# Patient Record
Sex: Female | Born: 1960 | Race: White | Hispanic: No | Marital: Married | Smoking: Never smoker
Health system: Southern US, Community
[De-identification: ages and names within clinical notes are randomized; demographics above are authoritative.]

## PROBLEM LIST (undated history)

## (undated) DIAGNOSIS — F329 Major depressive disorder, single episode, unspecified: Secondary | ICD-10-CM

## (undated) DIAGNOSIS — F32A Depression, unspecified: Secondary | ICD-10-CM

## (undated) DIAGNOSIS — F419 Anxiety disorder, unspecified: Secondary | ICD-10-CM

## (undated) HISTORY — PX: KNEE SURGERY: SHX244

## (undated) HISTORY — PX: ABDOMINAL SURGERY: SHX537

## (undated) HISTORY — PX: TRACHEOSTOMY: SUR1362

---

## 2002-08-05 ENCOUNTER — Emergency Department (HOSPITAL_COMMUNITY): Admission: EM | Admit: 2002-08-05 | Discharge: 2002-08-05 | Payer: Self-pay | Admitting: Emergency Medicine

## 2003-09-26 ENCOUNTER — Other Ambulatory Visit: Admission: RE | Admit: 2003-09-26 | Discharge: 2003-09-26 | Payer: Self-pay | Admitting: Family Medicine

## 2004-10-11 ENCOUNTER — Other Ambulatory Visit: Admission: RE | Admit: 2004-10-11 | Discharge: 2004-10-11 | Payer: Self-pay | Admitting: Family Medicine

## 2006-03-02 ENCOUNTER — Other Ambulatory Visit: Admission: RE | Admit: 2006-03-02 | Discharge: 2006-03-02 | Payer: Self-pay | Admitting: Family Medicine

## 2007-08-09 ENCOUNTER — Other Ambulatory Visit: Admission: RE | Admit: 2007-08-09 | Discharge: 2007-08-09 | Payer: Self-pay | Admitting: Family Medicine

## 2011-05-20 ENCOUNTER — Encounter: Payer: Self-pay | Admitting: *Deleted

## 2011-05-20 ENCOUNTER — Emergency Department
Admission: EM | Admit: 2011-05-20 | Discharge: 2011-05-20 | Disposition: A | Discharge status: Home / Self Care | Payer: BC Managed Care – PPO | Source: Home / Self Care | Attending: Family Medicine | Admitting: Family Medicine

## 2011-05-20 DIAGNOSIS — H6592 Unspecified nonsuppurative otitis media, left ear: Secondary | ICD-10-CM

## 2011-05-20 DIAGNOSIS — H6590 Unspecified nonsuppurative otitis media, unspecified ear: Secondary | ICD-10-CM

## 2011-05-20 DIAGNOSIS — J019 Acute sinusitis, unspecified: Secondary | ICD-10-CM

## 2011-05-20 HISTORY — DX: Wilson's disease: E83.01

## 2011-05-20 MED ORDER — AMOXICILLIN 875 MG PO TABS: 875.0000 mg | ORAL_TABLET | Freq: Two times a day (BID) | ORAL | Status: AC

## 2011-05-20 MED ORDER — PREDNISONE 10 MG PO TABS: ORAL_TABLET | ORAL | Status: DC

## 2011-05-20 NOTE — ED Provider Notes (Signed)
History     CSN: [id number] Arrival date & time: 05/20/2011 11:29 AM   First MD Initiated Contact with Patient 05/20/11 1158      Chief Complaint  Patient presents with  . Cough  . Nasal Congestion  . Otalgia      HPI Comments: Patient states that she had a cold about 1.5 months ago, now resolved, and then possibly the flu about 10 days ago, also resolved.  She has had persistent sinus congestion for the past month.  Over the past several days she has developed a left earache and increased sinus discomfort  The history is provided by the patient.    Past Medical History  Diagnosis Date  . Wilson disease     Past Surgical History  Procedure Date  . Tracheostomy     age 50 due to allergic reaction  . Abdominal surgery     c-section    Family History  Problem Relation Age of Onset  . Diabetes Mother   . Asthma Father     smoker    History  Substance Use Topics  . Smoking status: Never Smoker   . Smokeless tobacco: Not on file  . Alcohol Use: Yes    OB History    Grav Para Term Preterm Abortions TAB SAB Ect Mult Living                  Review of Systems  Constitutional: Positive for fever, chills and fatigue.  HENT: Positive for hearing loss, ear pain, congestion, sore throat, rhinorrhea, postnasal drip and sinus pressure.   Eyes: Negative.   Respiratory: Positive for cough. Negative for shortness of breath.   Cardiovascular: Negative.   Gastrointestinal: Negative.   Genitourinary: Negative.   Musculoskeletal: Negative.   Skin: Negative.   Neurological: Positive for headaches.    Allergies  Dipth, acell pertus,  Home Medications   Current Outpatient Rx  Name Route Sig Dispense Refill  . PENICILLAMINE 250 MG PO CAPS Oral Take 1,000 mg by mouth daily.        BP 127/84  Pulse 86  Temp(Src) 97.6 F (36.4 C) (Oral)  Resp 18  Ht 5\' 2"  (1.575 m)  Wt 154 lb 12 oz (70.194 kg)  BMI 28.30 kg/m2  SpO2 99%  LMP 05/12/2011  Physical Exam  Nursing  note and vitals reviewed. Constitutional: She is oriented to person, place, and time. She appears well-developed and well-nourished. No distress.  HENT:  Right Ear: Tympanic membrane and ear canal normal.  Left Ear: Ear canal normal. A middle ear effusion is present.  Nose: Mucosal edema and sinus tenderness present. Right sinus exhibits maxillary sinus tenderness. Left sinus exhibits maxillary sinus tenderness.       Left ear reveals serous effusion.    Cardiovascular: Regular rhythm and normal heart sounds.   Pulmonary/Chest: Effort normal and breath sounds normal. No respiratory distress. She has no wheezes. She has no rales.  Abdominal: Soft. There is no tenderness.  Musculoskeletal:       Right lower leg: She exhibits no edema.       Left lower leg: She exhibits no edema.  Neurological: She is alert and oriented to person, place, and time.  Skin: Skin is warm and dry. No rash noted.  Psychiatric: She has a normal mood and affect.    ED Course  Procedures  none      1. Acute sinusitis   2. Left serous otitis media  MDM  Begin amoxicillin and tapering course of prednisone.  Take Mucinex D (guaifenesin with decongestant) twice daily for congestion, or may take Mucinex plus Sudafed.  Increase fluid intake, rest. May use Afrin nasal spray (or generic oxymetazoline) twice daily for about 5 days.  Also recommend using saline nasal spray several times daily and/or saline nasal irrigation. Stop all antihistamines for now, and other non-prescription cough/cold preparations. May take Ibuprofen 200mg , 4 tabs every 8 hours with food for chest/sternum discomfort. Recommend flu shot. Follow-up with family doctor if not improving 7 to 10 days.       Donna Christen, MD 05/22/11 [phone]

## 2011-05-20 NOTE — ED Notes (Signed)
Pt c/o cough, nasal congestion, and LT ear ache x 2 wks. She saw her PCP on 1214/12 was dx with a virus, [place] is going out of the country [place] wk and request an ABT. She has taken Mucinex, Theraflu, and Tylenol.

## 2011-06-09 ENCOUNTER — Emergency Department (INDEPENDENT_AMBULATORY_CARE_PROVIDER_SITE_OTHER)
Admission: EM | Admit: 2011-06-09 | Discharge: 2011-06-09 | Disposition: A | Discharge status: Home / Self Care | Payer: BC Managed Care – PPO | Source: Home / Self Care | Attending: Emergency Medicine | Admitting: Emergency Medicine

## 2011-06-09 ENCOUNTER — Encounter: Payer: Self-pay | Admitting: *Deleted

## 2011-06-09 DIAGNOSIS — H6692 Otitis media, unspecified, left ear: Secondary | ICD-10-CM

## 2011-06-09 DIAGNOSIS — J069 Acute upper respiratory infection, unspecified: Secondary | ICD-10-CM

## 2011-06-09 DIAGNOSIS — J329 Chronic sinusitis, unspecified: Secondary | ICD-10-CM

## 2011-06-09 DIAGNOSIS — H6690 Otitis media, unspecified, unspecified ear: Secondary | ICD-10-CM

## 2011-06-09 MED ORDER — AMOXICILLIN-POT CLAVULANATE 875-125 MG PO TABS: 1.0000 | ORAL_TABLET | Freq: Two times a day (BID) | ORAL | Status: AC

## 2011-06-09 NOTE — ED Notes (Signed)
Pt c/o LT ear ache and crackling sound x 1wk. She also c/o some nasal congestion.

## 2011-06-09 NOTE — ED Provider Notes (Signed)
History     CSN: [id number]  Arrival date & time 06/09/11  1556   First MD Initiated Contact with Patient 06/09/11 1604      Chief Complaint  Patient presents with  . Otalgia    (Consider location/radiation/quality/duration/timing/severity/associated sxs/prior treatment) HPI Teresa Payne is a 51 y.o. female who complains of continued upper respiratory symptoms. She was treated with antibiotic and steroid last month which seemed to help a lot. Then she flew to [place] and feels that she got sick again. Her main concern today is left ear pain. She is also complaining of mild runny nose and congestion and sinus pressure especially on the left side.  No throat pain No wheezing No sinus pain/pressure No chest congestion No hemoptysis No SOB No chills/sweats No fever No nausea No vomiting No abdominal pain No diarrhea No skin rashes No fatigue No myalgias No headache     Past Medical History  Diagnosis Date  . Wilson disease     Past Surgical History  Procedure Date  . Tracheostomy     age 48 due to allergic reaction  . Abdominal surgery     c-section    Family History  Problem Relation Age of Onset  . Diabetes Mother   . Asthma Father     smoker    History  Substance Use Topics  . Smoking status: Never Smoker   . Smokeless tobacco: Not on file  . Alcohol Use: Yes    OB History    Grav Para Term Preterm Abortions TAB SAB Ect Mult Living                  Review of Systems  Allergies  Dipth, acell pertus,  Home Medications   Current Outpatient Rx  Name Route Sig Dispense Refill  . FLUOXETINE HCL 20 MG PO CAPS Oral Take 20 mg by mouth daily.      . AMOXICILLIN-POT CLAVULANATE 875-125 MG PO TABS Oral Take 1 tablet by mouth 2 (two) times daily. 20 tablet 0  . PENICILLAMINE 250 MG PO CAPS Oral Take 1,000 mg by mouth daily.      Marland Kitchen PREDNISONE 10 MG PO TABS  Take 2 tabs twice daily for two days, then one tab twice daily for 2 days, then 1 daily for two days.   Take PC 14 tablet 0    BP 133/86  Pulse 102  Temp(Src) 97.9 F (36.6 C) (Oral)  Resp 18  Ht 5\' 2"  (1.575 m)  Wt 156 lb 12 oz (71.101 kg)  BMI 28.67 kg/m2  SpO2 97%  LMP 05/12/2011  Physical Exam  Nursing note and vitals reviewed. Constitutional: She is oriented to person, place, and time. She appears well-developed and well-nourished.  HENT:  Head: Normocephalic and atraumatic.  Right Ear: Tympanic membrane, external ear and ear canal normal.  Left Ear: External ear and ear canal normal. Tympanic membrane is erythematous and bulging.  Nose: Mucosal edema and rhinorrhea present.  Mouth/Throat: Posterior oropharyngeal erythema present. No oropharyngeal exudate or posterior oropharyngeal edema.  Eyes: No scleral icterus.  Neck: Neck supple.  Cardiovascular: Regular rhythm and normal heart sounds.   Pulmonary/Chest: Effort normal and breath sounds normal. No respiratory distress.  Neurological: She is alert and oriented to person, place, and time.  Skin: Skin is warm and dry.  Psychiatric: She has a normal mood and affect. Her speech is normal.    ED Course  Procedures (including critical care time)  Labs Reviewed - No data to display  No results found.   1. Sinusitis   2. Acute upper respiratory infections of unspecified site   3. Otitis media, left       MDM  1)  Take the prescribed antibiotic as instructed.  If she is not improving after this course of antibiotics, I would suggest that she followup with an ear nose throat Dr. 2)  Use nasal saline solution (over the counter) at least 3 times a day. 3)  Use over the counter decongestants like Zyrtec-D every 12 hours as needed to help with congestion.  If you have hypertension, do not take medicines with sudafed.  4)  Can take tylenol every 6 hours or motrin every 8 hours for pain or fever. 5)  Follow up with your primary doctor if no improvement in 5-7 days, sooner if increasing pain, fever, or new symptoms.        Lily Kocher, MD 06/09/11 [phone]

## 2011-07-01 ENCOUNTER — Encounter: Payer: Self-pay | Admitting: *Deleted

## 2011-07-01 ENCOUNTER — Emergency Department
Admit: 2011-07-01 | Discharge: 2011-07-01 | Disposition: A | Discharge status: Home / Self Care | Payer: BC Managed Care – PPO

## 2011-07-01 ENCOUNTER — Emergency Department
Admission: EM | Admit: 2011-07-01 | Discharge: 2011-07-01 | Disposition: A | Discharge status: Home / Self Care | Payer: BC Managed Care – PPO | Source: Home / Self Care | Attending: Family Medicine | Admitting: Family Medicine

## 2011-07-01 DIAGNOSIS — H6990 Unspecified Eustachian tube disorder, unspecified ear: Secondary | ICD-10-CM

## 2011-07-01 DIAGNOSIS — J0100 Acute maxillary sinusitis, unspecified: Secondary | ICD-10-CM

## 2011-07-01 DIAGNOSIS — H6980 Other specified disorders of Eustachian tube, unspecified ear: Secondary | ICD-10-CM

## 2011-07-01 MED ORDER — PREDNISONE 10 MG PO TABS: ORAL_TABLET | ORAL | Status: DC

## 2011-07-01 MED ORDER — AMOXICILLIN-POT CLAVULANATE 875-125 MG PO TABS: 1.0000 | ORAL_TABLET | Freq: Two times a day (BID) | ORAL | Status: AC

## 2011-07-01 MED ORDER — MOMETASONE FUROATE 50 MCG/ACT NA SUSP: NASAL | Status: AC

## 2011-07-01 NOTE — ED Notes (Signed)
Patient has been treated for sinusitis 2 times in the past month. She has returned with the same symptoms. She finished her latest antibiotic. Denies fever. C/o sinus pain, congestion and HA.

## 2011-07-01 NOTE — ED Provider Notes (Signed)
History     CSN: [id number]  Arrival date & time 07/01/11  7829   First MD Initiated Contact with Patient 07/01/11 1014      Chief Complaint  Patient presents with  . Sinusitis     HPI Comments: Patient reports that her sinus congestion improved after her previous treatment, but she has now developed recurrent left sinus congestion, worse in the mornings.  She also complains of constant feeling of mild blockage in her left ear and slightly decreased hearing there also.  She develops a left side headache several times weekly when her sinus congestion is worse.  Her right nasal passages are generally clear.  Her nasal mucous has become cloudy over the past several days.  No sore throat.  No fevers, chills, and sweats.  Occasional cough.  She feels well otherwise.  The history is provided by the patient.    Past Medical History  Diagnosis Date  . Wilson disease     Past Surgical History  Procedure Date  . Tracheostomy     age 62 due to allergic reaction  . Abdominal surgery     c-section    Family History  Problem Relation Age of Onset  . Diabetes Mother   . Asthma Father     smoker    History  Substance Use Topics  . Smoking status: Never Smoker   . Smokeless tobacco: Not on file  . Alcohol Use: Yes    OB History    Grav Para Term Preterm Abortions TAB SAB Ect Mult Living                  Review of Systems No sore throat + cough No pleuritic pain No wheezing + nasal congestion + post-nasal drainage + sinus pain/pressure No itchy/red eyes No earache, but left ear feels clogged No hemoptysis No SOB No fever/chills No nausea No vomiting No abdominal pain No diarrhea No urinary symptoms No skin rashes No fatigue No myalgias + left headache Used OTC meds without relief  Allergies  Dipth, acell pertus,  Home Medications   Current Outpatient Rx  Name Route Sig Dispense Refill  . AMOXICILLIN-POT CLAVULANATE 875-125 MG PO TABS Oral Take 1 tablet by  mouth 2 (two) times daily. Take with food 20 tablet 0  . FLUOXETINE HCL 20 MG PO CAPS Oral Take 20 mg by mouth daily.      . MOMETASONE FUROATE 50 MCG/ACT NA SUSP  Place two sprays in each nostril once daily 17 g 1  . PENICILLAMINE 250 MG PO CAPS Oral Take 1,000 mg by mouth daily.      Marland Kitchen PREDNISONE 10 MG PO TABS  Take 2 tabs twice daily for two days, then one tab twice daily for 2 days, then 1 daily for two days.  Take PC 14 tablet 0    BP 122/89  Pulse 90  Temp(Src) 98.2 F (36.8 C) (Oral)  Resp 16  SpO2 98%  Physical Exam Nursing notes and Vital Signs reviewed. Appearance:  Patient appears healthy, stated age, and in no acute distress Eyes:  Pupils are equal, round, and reactive to light and accomodation.  Extraocular movement is intact.  Conjunctivae are not inflamed.  Fundi benign  Ears:  Canals normal.  Tympanic membranes normal.  Nose:  Moderately congested turbinates, worse on the left.   Mild left maxillary and frontal sinus tenderness is present.  Pharynx:  Normal Neck:  Supple.  Slightly tender shotty anterior/posterior nodes are palpated on the  left. Lungs:  Clear to auscultation.  Breath sounds are equal.  Heart:  Regular rate and rhythm without murmurs, rubs, or gallops.  Skin:  No rash present.  Neurologic:  Cranial nerves 2 through 12 are normal.  Patellar, achilles, and elbow reflexes are normal.  Cerebellar function is intact (finger-to-nose and rapid alternating hand movement).  Gait and station are normal.    ED Course  Procedures none  Labs Reviewed - Tympanogram normal in right ear and wide in left ear. Dg Sinuses Complete  07/01/2011  *RADIOLOGY REPORT*  Clinical Data: Sinus congestion, pressure, headaches  PARANASAL SINUSES - COMPLETE 3 + VIEW  Comparison: None.  Findings: Frontal, ethmoid, maxillary and sphenoid sinuses appear well aerated.  No significant sinus opacification, asymmetry, or air-fluid level demonstrated by plain radiography.  Orbits appear  symmetric.  Mastoids appear clear.  Dental hardware noted.  IMPRESSION: Clear sinuses by plain radiography.  Original Report Authenticated By: Judie Petit. Ruel Favors, M.D.     1. Eustachian tube dysfunction   2. Acute maxillary sinusitis       MDM  Suspect chronic rhinitis.  Although left TM appears normal, suspect left otitis media with abnormal tympanogram. Repeat course of antibiotic with Augmentin.  Repeat tapering course of prednisone.  Begin Nasonex nasal spray. Followup with ENT in two weeks.        Donna Christen, MD 07/01/11 1209

## 2011-11-02 ENCOUNTER — Other Ambulatory Visit: Payer: Self-pay | Admitting: Family Medicine

## 2012-08-05 ENCOUNTER — Encounter: Payer: Self-pay | Admitting: *Deleted

## 2012-08-05 ENCOUNTER — Emergency Department
Admission: EM | Admit: 2012-08-05 | Discharge: 2012-08-05 | Disposition: A | Discharge status: Home / Self Care | Payer: BC Managed Care – PPO | Source: Home / Self Care

## 2012-08-05 DIAGNOSIS — H6591 Unspecified nonsuppurative otitis media, right ear: Secondary | ICD-10-CM

## 2012-08-05 DIAGNOSIS — H6590 Unspecified nonsuppurative otitis media, unspecified ear: Secondary | ICD-10-CM

## 2012-08-05 MED ORDER — AMOXICILLIN 875 MG PO TABS: 875.0000 mg | ORAL_TABLET | Freq: Two times a day (BID) | ORAL | Status: DC

## 2012-08-05 MED ORDER — PREDNISONE 20 MG PO TABS: 20.0000 mg | ORAL_TABLET | Freq: Two times a day (BID) | ORAL | Status: DC

## 2012-08-05 NOTE — ED Provider Notes (Signed)
History     CSN: [id number]  Arrival date & time 08/05/12  0840   None     Chief Complaint  Patient presents with  . Otalgia  . Sinus Problem       HPI Comments: Patient has a history of perennial allergies and normally has a frequent cough.  Five days ago she developed a mild sore throat, increased sinus congestion, fatigue, myalgias, and chills.  Her cough gradually became worse, and last night she developed a right earache. She takes Zyrtec daily.  The history is provided by the patient.    Past Medical History  Diagnosis Date  . Wilson disease     Past Surgical History  Procedure Laterality Date  . Tracheostomy      age 50 due to allergic reaction  . Abdominal surgery      c-section    Family History  Problem Relation Age of Onset  . Diabetes Mother   . Asthma Father     smoker    History  Substance Use Topics  . Smoking status: Never Smoker   . Smokeless tobacco: Not on file  . Alcohol Use: Yes    OB History   Grav Para Term Preterm Abortions TAB SAB Ect Mult Living                  Review of Systems + sore throat, resolved + cough No pleuritic pain No wheezing + nasal congestion + post-nasal drainage + sinus pain/pressure No itchy/red eyes + right earache No hemoptysis No SOB No fever, + chills No nausea No vomiting No abdominal pain No diarrhea No urinary symptoms No skin rashes + fatigue + myalgias + headache Used OTC meds without relief   Allergies  Dipth, acell pertus,  Home Medications   Current Outpatient Rx  Name  Route  Sig  Dispense  Refill  . amoxicillin (AMOXIL) 875 MG tablet   Oral   Take 1 tablet (875 mg total) by mouth 2 (two) times daily.   20 tablet   0   . FLUoxetine (PROZAC) 20 MG capsule   Oral   Take 20 mg by mouth daily.           . mometasone (NASONEX) 50 MCG/ACT nasal spray      Place two sprays in each nostril once daily   17 g   1   . penicillAMINE (CUPRIMINE) 250 MG capsule   Oral  Take 1,000 mg by mouth daily.           . predniSONE (DELTASONE) 20 MG tablet   Oral   Take 1 tablet (20 mg total) by mouth 2 (two) times daily. Take with food.   10 tablet   0     BP 130/85  Pulse 93  Temp(Src) 98 F (36.7 C) (Oral)  Ht 5\' 2"  (1.575 m)  Wt 157 lb (71.215 kg)  BMI 28.71 kg/m2  SpO2 99%  Physical Exam Nursing notes and Vital Signs reviewed. Appearance:  Patient appears healthy, stated age, and in no acute distress Eyes:  Pupils are equal, round, and reactive to light and accomodation.  Extraocular movement is intact.  Conjunctivae are not inflamed  Ears:  Canals normal.  Left tympanic membrane normal.  Right tympanic membrane red and bulging with effusion.  Nose:  Congested turbinates, completely occluded on right.  Frontal and right maxillary sinus tenderness is present.  Pharynx:  Normal Neck:  Supple.   Tender shotty posterior nodes are palpated bilaterally  Lungs:  Clear to auscultation.  Breath sounds are equal.  Heart:  Regular rate and rhythm without murmurs, rubs, or gallops.  Abdomen:  Nontender without masses or hepatosplenomegaly.  Bowel sounds are present.  No CVA or flank tenderness.  Extremities:  No edema.  No calf tenderness Skin:  No rash present.   ED Course  Procedures  none      1. Right otitis media with effusion   2. Acute maxillary sinusitis   3. Acute upper respiratory infections of unspecified site       MDM  Begin amoxicillin for 10 days, and prednisone burst. Take Mucinex D (guaifenesin with decongestant) twice daily for congestion.  Increase fluid intake, rest. May use Afrin nasal spray (or generic oxymetazoline) twice daily for about 5 days.  Also recommend using saline nasal spray several times daily and saline nasal irrigation (AYR is a common brand) Stop all antihistamines for now, and other non-prescription cough/cold preparations. May take Delsym Cough Suppressant at bedtime for nighttime cough.  Followup with ENT  if not improving.        Lattie Haw, MD 08/05/12 1040

## 2012-08-05 NOTE — ED Notes (Signed)
Teresa Payne c/o right ear pain x last night. Sinus congestion and drainage x 4 days. Used Mucinex D and Alka seltzer otc.

## 2012-09-25 ENCOUNTER — Emergency Department
Admission: EM | Admit: 2012-09-25 | Discharge: 2012-09-25 | Disposition: A | Discharge status: Home / Self Care | Payer: BC Managed Care – PPO | Source: Home / Self Care | Attending: Family Medicine | Admitting: Family Medicine

## 2012-09-25 ENCOUNTER — Encounter: Payer: Self-pay | Admitting: Emergency Medicine

## 2012-09-25 DIAGNOSIS — B309 Viral conjunctivitis, unspecified: Secondary | ICD-10-CM

## 2012-09-25 DIAGNOSIS — J069 Acute upper respiratory infection, unspecified: Secondary | ICD-10-CM

## 2012-09-25 MED ORDER — AMOXICILLIN 875 MG PO TABS: 875.0000 mg | ORAL_TABLET | Freq: Two times a day (BID) | ORAL | Status: DC

## 2012-09-25 MED ORDER — KETOROLAC TROMETHAMINE 0.5 % OP SOLN: 1.0000 [drp] | Freq: Four times a day (QID) | OPHTHALMIC | Status: AC

## 2012-09-25 NOTE — ED Notes (Signed)
Reports conjunctivitis and sticky eye discharge today; has had sinus problems past few days; works in [employer] facility and "pink eye" is circulating.

## 2012-09-25 NOTE — ED Provider Notes (Signed)
History     CSN: [id number]  Arrival date & time 09/25/12  1439   First MD Initiated Contact with Patient 09/25/12 1517      Chief Complaint  Patient presents with  . Conjunctivitis       HPI Comments: Patient complains of increased sinus congestion for about a week, including mild sore throat.  She has also developed a mild cough.  Today she noted mild bilateral pink eye with increased lacrimation.  She has a history of seasonal allergies also.  No fevers, chills, and sweats   The history is provided by the patient.    Past Medical History  Diagnosis Date  . Wilson disease     Past Surgical History  Procedure Laterality Date  . Tracheostomy      age 4 due to allergic reaction  . Abdominal surgery      c-section  . Knee surgery      Family History  Problem Relation Age of Onset  . Diabetes Mother   . Asthma Father     smoker    History  Substance Use Topics  . Smoking status: Never Smoker   . Smokeless tobacco: Not on file  . Alcohol Use: Yes    OB History   Grav Para Term Preterm Abortions TAB SAB Ect Mult Living                  Review of Systems + sore throat, resolved + cough No pleuritic pain No wheezing + nasal congestion + post-nasal drainage No sinus pain/pressure + itchy/red eyes No earache No hemoptysis No SOB No fever/chills No nausea No vomiting No abdominal pain No diarrhea No urinary symptoms No skin rashes + fatigue No myalgias No headache Used OTC meds without relief  Allergies  Dipth, acell pertus,  Home Medications   Current Outpatient Rx  Name  Route  Sig  Dispense  Refill  . amoxicillin (AMOXIL) 875 MG tablet   Oral   Take 1 tablet (875 mg total) by mouth 2 (two) times daily. (Rx void after 10/03/12)   20 tablet   0   . FLUoxetine (PROZAC) 20 MG capsule   Oral   Take 20 mg by mouth daily.           Marland Kitchen ketorolac (ACULAR) 0.5 % ophthalmic solution   Both Eyes   Place 1 drop into both eyes 4 (four) times  daily.   3 mL   0   . mometasone (NASONEX) 50 MCG/ACT nasal spray      Place two sprays in each nostril once daily   17 g   1   . penicillAMINE (CUPRIMINE) 250 MG capsule   Oral   Take 1,000 mg by mouth daily.           . predniSONE (DELTASONE) 20 MG tablet   Oral   Take 1 tablet (20 mg total) by mouth 2 (two) times daily. Take with food.   10 tablet   0     BP 110/70  Pulse 92  Temp(Src) 97.8 F (36.6 C) (Oral)  Resp 18  Ht 5\' 2"  (1.575 m)  Wt 158 lb (71.668 kg)  BMI 28.89 kg/m2  SpO2 97%  Physical Exam Nursing notes and Vital Signs reviewed. Appearance:  Patient appears healthy, stated age, and in no acute distress Eyes:  Pupils are equal, round, and reactive to light and accomodation.  Extraocular movement is intact.  Conjunctivae are minimally injected.  No discharge.  No  lid swelling.  No photophobia. Ears:  Canals normal.  Tympanic membranes normal.  Nose:  Mildly congested turbinates.  No sinus tenderness.  Marland Kitchen  Pharynx:  Normal Neck:  Supple.   Tender shotty posterior nodes are palpated bilaterally  Lungs:  Clear to auscultation.  Breath sounds are equal.  Heart:  Regular rate and rhythm without murmurs, rubs, or gallops.  Abdomen:  Nontender without masses or hepatosplenomegaly.  Bowel sounds are present.  No CVA or flank tenderness.  Extremities:  No edema.  No calf tenderness Skin:  No rash present.   ED Course  Procedures  none      1. Acute upper respiratory infections of unspecified site; suspect viral URI  2. Viral conjunctivitis       MDM  Begin Acular opthal soln Take Mucinex D (guaifenesin with decongestant) twice daily for congestion.  Increase fluid intake, rest. May use Afrin nasal spray (or generic oxymetazoline) twice daily for about 5 days.  Also recommend using saline nasal spray several times daily and saline nasal irrigation (AYR is a common brand) Stop using contact lens until eye irritation has resolved. Stop all  antihistamines for now, and other non-prescription cough/cold preparations. May take Delsym Cough Suppressant at bedtime for nighttime cough.  Begin Amoxicillin if not improving about 5 days or if persistent fever develops (Given a prescription to hold, with an expiration date)  Follow-up with family doctor if not improving 7 to 10 days.        Lattie Haw, MD 09/25/12 [phone]

## 2014-05-05 ENCOUNTER — Emergency Department
Admission: EM | Admit: 2014-05-05 | Discharge: 2014-05-05 | Disposition: A | Discharge status: Home / Self Care | Payer: BC Managed Care – PPO | Source: Home / Self Care | Attending: Family Medicine | Admitting: Family Medicine

## 2014-05-05 ENCOUNTER — Encounter: Payer: Self-pay | Admitting: Emergency Medicine

## 2014-05-05 DIAGNOSIS — J0101 Acute recurrent maxillary sinusitis: Secondary | ICD-10-CM

## 2014-05-05 DIAGNOSIS — B9789 Other viral agents as the cause of diseases classified elsewhere: Secondary | ICD-10-CM

## 2014-05-05 DIAGNOSIS — J069 Acute upper respiratory infection, unspecified: Secondary | ICD-10-CM

## 2014-05-05 HISTORY — DX: Major depressive disorder, single episode, unspecified: F32.9

## 2014-05-05 HISTORY — DX: Anxiety disorder, unspecified: F41.9

## 2014-05-05 HISTORY — DX: Depression, unspecified: F32.A

## 2014-05-05 MED ORDER — AMOXICILLIN 875 MG PO TABS: 875.0000 mg | ORAL_TABLET | Freq: Two times a day (BID) | ORAL | Status: DC

## 2014-05-05 MED ORDER — PREDNISONE 20 MG PO TABS: 20.0000 mg | ORAL_TABLET | Freq: Two times a day (BID) | ORAL | Status: DC

## 2014-05-05 MED ORDER — BENZONATATE 200 MG PO CAPS: 200.0000 mg | ORAL_CAPSULE | Freq: Every day | ORAL | Status: DC

## 2014-05-05 NOTE — Discharge Instructions (Signed)
Take plain Mucinex (1200 mg guaifenesin) twice daily for cough and congestion.  May add Sudafed for sinus congestion.   Increase fluid intake, rest. May use Afrin nasal spray (or generic oxymetazoline) twice daily for about 5 days.  Also recommend using saline nasal spray several times daily and saline nasal irrigation (AYR is a common brand).  Continue Qnasl spray. Try warm salt water gargles for sore throat.  Stop all antihistamines for now, and other non-prescription cough/cold preparations. May take Ibuprofen 200mg , 4 tabs every 8 hours with food for chest/sternum discomfort.   Follow-up with family doctor if not improving 7 to 10 days.

## 2014-05-05 NOTE — ED Provider Notes (Signed)
CSN: [id number]     Arrival date & time 05/05/14  1105 History   First MD Initiated Contact with Patient 05/05/14 1153     Chief Complaint  Patient presents with  . Nasal Congestion  . Generalized Body Aches  . Cough  . Fatigue  . Headache  . Sore Throat      HPI Comments: About 10 days ago patient developed URI symptoms wit sore throat, fever/chills, fatigue, myalgias, sinus congestion, decreased appetite, and productive cough.  She has developed increasing sinus congestion and facial pressure, and has now developed night sweats.  The history is provided by the patient.    Past Medical History  Diagnosis Date  . Wilson disease   . Anxiety and depression    Past Surgical History  Procedure Laterality Date  . Tracheostomy      age 39 due to allergic reaction  . Abdominal surgery      c-section  . Knee surgery     Family History  Problem Relation Age of Onset  . Diabetes Mother   . Asthma Father     smoker   History  Substance Use Topics  . Smoking status: Never Smoker   . Smokeless tobacco: Not on file  . Alcohol Use: Yes   OB History    No data available     Review of Systems + sore throat + cough No pleuritic pain + wheezing + nasal congestion + post-nasal drainage + sinus pain/pressure + red eyes No earache No hemoptysis + SOB + fever, + chills/sweats No nausea No vomiting No abdominal pain No diarrhea No urinary symptoms No skin rash + fatigue + myalgias + headache Used OTC meds without relief  Allergies  Dipth, acell pertus,  Home Medications   Prior to Admission medications   Medication Sig Start Date End Date Taking? Authorizing Provider  amoxicillin (AMOXIL) 875 MG tablet Take 1 tablet (875 mg total) by mouth 2 (two) times daily. 05/05/14   Lattie HawStephen A Fredis Malkiewicz, MD  benzonatate (TESSALON) 200 MG capsule Take 1 capsule (200 mg total) by mouth at bedtime. Take as needed for cough 05/05/14   Lattie HawStephen A Marzella Miracle, MD  FLUoxetine (PROZAC) 20 MG  capsule Take 20 mg by mouth daily.      Historical Provider, MD  ketorolac (ACULAR) 0.5 % ophthalmic solution Place 1 drop into both eyes 4 (four) times daily. 09/25/12   Lattie HawStephen A Alonia Dibuono, MD  mometasone (NASONEX) 50 MCG/ACT nasal spray Place two sprays in each nostril once daily 07/01/11   Lattie HawStephen A Mattalyn Anderegg, MD  penicillAMINE (CUPRIMINE) 250 MG capsule Take 1,000 mg by mouth daily.      Historical Provider, MD  predniSONE (DELTASONE) 20 MG tablet Take 1 tablet (20 mg total) by mouth 2 (two) times daily. Take with food. 05/05/14   Lattie HawStephen A Johnella Crumm, MD   BP 121/79 mmHg  Pulse 90  Temp(Src) 98.2 F (36.8 C) (Oral)  Resp 16  Ht 5\' 2"  (1.575 m)  Wt 157 lb (71.215 kg)  BMI 28.71 kg/m2  SpO2 97%  LMP 05/12/2011 Physical Exam Nursing notes and Vital Signs reviewed. Appearance:  Patient appears healthy, stated age, and in no acute distress Eyes:  Pupils are equal, round, and reactive to light and accomodation.  Extraocular movement is intact.  Conjunctivae are not inflamed  Ears:  Canals normal.  Tympanic membranes normal.  Nose:  Congested turbinates.  Maxillary sinus tenderness is present.  Pharynx:  Normal Neck:  Supple.   Tender posterior nodes  are palpated bilaterally  Lungs:  Clear to auscultation.  Breath sounds are equal.  Chest:  Distinct tenderness to palpation over the mid-sternum.  Heart:  Regular rate and rhythm without murmurs, rubs, or gallops.  Abdomen:  Nontender without masses or hepatosplenomegaly.  Bowel sounds are present.  No CVA or flank tenderness.  Extremities:  No edema.  No calf tenderness Skin:  No rash present.   ED Course  Procedures  none   MDM   1. Acute recurrent maxillary sinusitis   2. Viral URI with cough    Begin amoxicillin and prednisone burst.  Prescription written for Benzonatate (Tessalon) to take at bedtime for night-time cough.  Take plain Mucinex (1200 mg guaifenesin) twice daily for cough and congestion.  May add Sudafed for sinus congestion.    Increase fluid intake, rest. May use Afrin nasal spray (or generic oxymetazoline) twice daily for about 5 days.  Also recommend using saline nasal spray several times daily and saline nasal irrigation (AYR is a common brand).  Continue Qnasl spray. Try warm salt water gargles for sore throat.  Stop all antihistamines for now, and other non-prescription cough/cold preparations. May take Ibuprofen 200mg , 4 tabs every 8 hours with food for chest/sternum discomfort.   Follow-up with family doctor if not improving 7 to 10 days.     Lattie HawStephen A Airica Schwartzkopf, MD 05/07/14 [phone]

## 2014-05-05 NOTE — ED Notes (Signed)
Reports 11 days of congestion, cough, aches, sore throat, extreme fatigue and intermittent fever; has been taking Thera-Flu and other OTCs without success.

## 2014-05-30 ENCOUNTER — Emergency Department (INDEPENDENT_AMBULATORY_CARE_PROVIDER_SITE_OTHER): Payer: BC Managed Care – PPO

## 2014-05-30 ENCOUNTER — Encounter: Payer: Self-pay | Admitting: Emergency Medicine

## 2014-05-30 ENCOUNTER — Emergency Department
Admission: EM | Admit: 2014-05-30 | Discharge: 2014-05-30 | Disposition: A | Discharge status: Home / Self Care | Payer: BC Managed Care – PPO | Source: Home / Self Care | Attending: Emergency Medicine | Admitting: Emergency Medicine

## 2014-05-30 DIAGNOSIS — R059 Cough, unspecified: Secondary | ICD-10-CM

## 2014-05-30 DIAGNOSIS — R05 Cough: Secondary | ICD-10-CM

## 2014-05-30 DIAGNOSIS — J189 Pneumonia, unspecified organism: Secondary | ICD-10-CM | POA: Diagnosis not present

## 2014-05-30 MED ORDER — CEFTRIAXONE SODIUM 1 G IJ SOLR
1.0000 g | INTRAMUSCULAR | Status: DC
  Administered 2014-05-30 (×2): 1 g via INTRAMUSCULAR

## 2014-05-30 MED ORDER — PREDNISONE 50 MG PO TABS: ORAL_TABLET | ORAL | Status: DC

## 2014-05-30 MED ORDER — AZITHROMYCIN 250 MG PO TABS: 250.0000 mg | ORAL_TABLET | Freq: Every day | ORAL | Status: DC

## 2014-05-30 MED ORDER — ALBUTEROL SULFATE HFA 108 (90 BASE) MCG/ACT IN AERS: 1.0000 | INHALATION_SPRAY | Freq: Four times a day (QID) | RESPIRATORY_TRACT | Status: AC | PRN

## 2014-05-30 MED ORDER — AZITHROMYCIN 500 MG PO TABS: 1000.0000 mg | ORAL_TABLET | Freq: Once | ORAL | Status: DC

## 2014-05-30 NOTE — Discharge Instructions (Signed)
Pneumonia  Pneumonia is an infection of the lungs.   CAUSES  Pneumonia may be caused by bacteria or a virus. Usually, these infections are caused by breathing infectious particles into the lungs (respiratory tract).  SIGNS AND SYMPTOMS   · Cough.  · Fever.  · Chest pain.  · Increased rate of breathing.  · Wheezing.  · Mucus production.  DIAGNOSIS   If you have the common symptoms of pneumonia, your health care provider will typically confirm the diagnosis with a chest X-ray. The X-ray will show an abnormality in the lung (pulmonary infiltrate) if you have pneumonia. Other tests of your blood, urine, or sputum may be done to find the specific cause of your pneumonia. Your health care provider may also do tests (blood gases or pulse oximetry) to see how well your lungs are working.  TREATMENT   Some forms of pneumonia may be spread to other people when you cough or sneeze. You may be asked to wear a mask before and during your exam. Pneumonia that is caused by bacteria is treated with antibiotic medicine. Pneumonia that is caused by the influenza virus may be treated with an antiviral medicine. Most other viral infections must run their course. These infections will not respond to antibiotics.   HOME CARE INSTRUCTIONS   · Cough suppressants may be used if you are losing too much rest. However, coughing protects you by clearing your lungs. You should avoid using cough suppressants if you can.  · Your health care provider may have prescribed medicine if he or she thinks your pneumonia is caused by bacteria or influenza. Finish your medicine even if you start to feel better.  · Your health care provider may also prescribe an expectorant. This loosens the mucus to be coughed up.  · Take medicines only as directed by your health care provider.  · Do not smoke. Smoking is a common cause of bronchitis and can contribute to pneumonia. If you are a smoker and continue to smoke, your cough may last several weeks after your  pneumonia has cleared.  · A cold steam vaporizer or humidifier in your room or home may help loosen mucus.  · Coughing is often worse at night. Sleeping in a semi-upright position in a recliner or using a couple pillows under your head will help with this.  · Get rest as you feel it is needed. Your body will usually let you know when you need to rest.  PREVENTION  A pneumococcal shot (vaccine) is available to prevent a common bacterial cause of pneumonia. This is usually suggested for:  · People over 65 years old.  · Patients on chemotherapy.  · People with chronic lung problems, such as bronchitis or emphysema.  · People with immune system problems.  If you are over 65 or have a high risk condition, you may receive the pneumococcal vaccine if you have not received it before. In some countries, a routine influenza vaccine is also recommended. This vaccine can help prevent some cases of pneumonia. You may be offered the influenza vaccine as part of your care.  If you smoke, it is time to quit. You may receive instructions on how to stop smoking. Your health care provider can provide medicines and counseling to help you quit.  SEEK MEDICAL CARE IF:  You have a fever.  SEEK IMMEDIATE MEDICAL CARE IF:   · Your illness becomes worse. This is especially true if you are elderly or weakened from any other disease.  ·   You cannot control your cough with suppressants and are losing sleep.  · You begin coughing up blood.  · You develop pain which is getting worse or is uncontrolled with medicines.  · Any of the symptoms which initially brought you in for treatment are getting worse rather than better.  · You develop shortness of breath or chest pain.  MAKE SURE YOU:   · Understand these instructions.  · Will watch your condition.  · Will get help right away if you are not doing well or get worse.  Document Released: 05/19/2005 Document Revised: 10/03/2013 Document Reviewed: 08/08/2010  ExitCare® Patient Information ©2015  [employer]. This information is not intended to replace advice given to you by your health care provider. Make sure you discuss any questions you have with your health care provider.    Asthma  Asthma is a recurring condition in which the airways tighten and narrow. Asthma can make it difficult to breathe. It can cause coughing, wheezing, and shortness of breath. Asthma episodes, also called asthma attacks, range from minor to life-threatening. Asthma cannot be cured, but medicines and lifestyle changes can help control it.  CAUSES  Asthma is believed to be caused by inherited (genetic) and environmental factors, but its exact cause is unknown. Asthma may be triggered by allergens, lung infections, or irritants in the air. Asthma triggers are different for each person. Common triggers include:   · Animal dander.  · Dust mites.  · Cockroaches.  · Pollen from trees or grass.  · Mold.  · Smoke.  · Air pollutants such as dust, household cleaners, hair sprays, aerosol sprays, paint fumes, strong chemicals, or strong odors.  · Cold air, weather changes, and winds (which increase molds and pollens in the air).  · Strong emotional expressions such as crying or laughing hard.  · Stress.  · Certain medicines (such as aspirin) or types of drugs (such as beta-blockers).  · Sulfites in foods and drinks. Foods and drinks that may contain sulfites include dried fruit, potato chips, and sparkling grape juice.  · Infections or inflammatory conditions such as the flu, a cold, or an inflammation of the nasal membranes (rhinitis).  · Gastroesophageal reflux disease (GERD).  · Exercise or strenuous activity.  SYMPTOMS  Symptoms may occur immediately after asthma is triggered or many hours later. Symptoms include:  · Wheezing.  · Excessive nighttime or early morning coughing.  · Frequent or severe coughing with a common cold.  · Chest tightness.  · Shortness of breath.  DIAGNOSIS   The diagnosis of asthma is made by a review of your  medical history and a physical exam. Tests may also be performed. These may include:  · Lung function studies. These tests show how much air you breathe in and out.  · Allergy tests.  · Imaging tests such as X-rays.  TREATMENT   Asthma cannot be cured, but it can usually be controlled. Treatment involves identifying and avoiding your asthma triggers. It also involves medicines. There are 2 classes of medicine used for asthma treatment:   · Controller medicines. These prevent asthma symptoms from occurring. They are usually taken every day.  · Reliever or rescue medicines. These quickly relieve asthma symptoms. They are used as needed and provide short-term relief.  Your health care provider will help you create an asthma action plan. An asthma action plan is a written plan for managing and treating your asthma attacks. It includes a list of your asthma triggers and how   they may be avoided. It also includes information on when medicines should be taken and when their dosage should be changed. An action plan may also involve the use of a device called a peak flow meter. A peak flow meter measures how well the lungs are working. It helps you monitor your condition.  HOME CARE INSTRUCTIONS   · Take medicines only as directed by your health care provider. Speak with your health care provider if you have questions about how or when to take the medicines.  · Use a peak flow meter as directed by your health care provider. Record and keep track of readings.  · Understand and use the action plan to help minimize or stop an asthma attack without needing to seek medical care.  · Control your home environment in the following ways to help prevent asthma attacks:  ¨ Do not smoke. Avoid being exposed to secondhand smoke.  ¨ Change your heating and air conditioning filter regularly.  ¨ Limit your use of fireplaces and wood stoves.  ¨ Get rid of pests (such as roaches and mice) and their droppings.  ¨ Throw away plants if you see  mold on them.  ¨ Clean your floors and dust regularly. Use unscented cleaning products.  ¨ Try to have someone else vacuum for you regularly. Stay out of rooms while they are being vacuumed and for a short while afterward. If you vacuum, use a dust mask from a hardware store, a double-layered or microfilter vacuum cleaner bag, or a vacuum cleaner with a HEPA filter.  ¨ Replace carpet with wood, tile, or vinyl flooring. Carpet can trap dander and dust.  ¨ Use allergy-proof pillows, mattress covers, and box spring covers.  ¨ Wash bed sheets and blankets every week in hot water and dry them in a dryer.  ¨ Use blankets that are made of polyester or cotton.  ¨ Clean bathrooms and kitchens with bleach. If possible, have someone repaint the walls in these rooms with mold-resistant paint. Keep out of the rooms that are being cleaned and painted.  ¨ Wash hands frequently.  SEEK MEDICAL CARE IF:   · You have wheezing, shortness of breath, or a cough even if taking medicine to prevent attacks.  · The colored mucus you cough up (sputum) is thicker than usual.  · Your sputum changes from clear or white to yellow, green, gray, or bloody.  · You have any problems that may be related to the medicines you are taking (such as a rash, itching, swelling, or trouble breathing).  · You are using a reliever medicine more than 2-3 times per week.  · Your peak flow is still at 50-79% of your personal best after following your action plan for 1 hour.  · You have a fever.  SEEK IMMEDIATE MEDICAL CARE IF:   · You seem to be getting worse and are unresponsive to treatment during an asthma attack.  · You are short of breath even at rest.  · You get short of breath when doing very little physical activity.  · You have difficulty eating, drinking, or talking due to asthma symptoms.  · You develop chest pain.  · You develop a fast heartbeat.  · You have a bluish color to your lips or fingernails.  · You are light-headed, dizzy, or faint.  · Your  peak flow is less than 50% of your personal best.  MAKE SURE YOU:   · Understand these instructions.  · Will watch your   condition.  · Will get help right away if you are not doing well or get worse.  Document Released: 05/19/2005 Document Revised: 10/03/2013 Document Reviewed: 12/16/2012  ExitCare® Patient Information ©2015 [employer]. This information is not intended to replace advice given to you by your health care provider. Make sure you discuss any questions you have with your health care provider.

## 2014-05-30 NOTE — ED Notes (Signed)
Productive cough, green mucus x 2 weeks, congested, cant sleep

## 2014-05-30 NOTE — ED Provider Notes (Signed)
CSN: [id number]     Arrival date & time 05/30/14  1254 History   First MD Initiated Contact with Patient 05/30/14 1323     Chief Complaint  Patient presents with  . Cough   (Consider location/radiation/quality/duration/timing/severity/associated sxs/prior Treatment) Patient is a 53 y.o. female presenting with cough. The history is provided by the patient. No language interpreter was used.  Cough Cough characteristics:  Productive Sputum characteristics:  Nondescript Severity:  Moderate Onset quality:  Gradual Duration:  1 week Timing:  Constant Chronicity:  New Smoker: no   Context: with activity   Relieved by:  Nothing Worsened by:  Nothing tried Ineffective treatments:  None tried Associated symptoms: myalgias   Associated symptoms: no chest pain and no shortness of breath   Risk factors: recent infection     Past Medical History  Diagnosis Date  . Wilson disease   . Anxiety and depression    Past Surgical History  Procedure Laterality Date  . Tracheostomy      age 70 due to allergic reaction  . Abdominal surgery      c-section  . Knee surgery     Family History  Problem Relation Age of Onset  . Diabetes Mother   . Asthma Father     smoker   History  Substance Use Topics  . Smoking status: Never Smoker   . Smokeless tobacco: Not on file  . Alcohol Use: Yes   OB History    No data available     Review of Systems  Respiratory: Positive for cough. Negative for shortness of breath.   Cardiovascular: Negative for chest pain.  Musculoskeletal: Positive for myalgias.  All other systems reviewed and are negative.   Allergies  Dipth, acell pertus,  Home Medications   Prior to Admission medications   Medication Sig Start Date End Date Taking? Authorizing Provider  guaifenesin (ROBITUSSIN) 100 MG/5ML syrup Take 200 mg by mouth 3 (three) times daily as needed for cough.   Yes Historical Provider, MD  amoxicillin (AMOXIL) 875 MG tablet Take 1 tablet (875 mg  total) by mouth 2 (two) times daily. 05/05/14   Lattie HawStephen A Beese, MD  benzonatate (TESSALON) 200 MG capsule Take 1 capsule (200 mg total) by mouth at bedtime. Take as needed for cough 05/05/14   Lattie HawStephen A Beese, MD  FLUoxetine (PROZAC) 20 MG capsule Take 20 mg by mouth daily.      Historical Provider, MD  ketorolac (ACULAR) 0.5 % ophthalmic solution Place 1 drop into both eyes 4 (four) times daily. 09/25/12   Lattie HawStephen A Beese, MD  mometasone (NASONEX) 50 MCG/ACT nasal spray Place two sprays in each nostril once daily 07/01/11   Lattie HawStephen A Beese, MD  penicillAMINE (CUPRIMINE) 250 MG capsule Take 1,000 mg by mouth daily.      Historical Provider, MD  predniSONE (DELTASONE) 20 MG tablet Take 1 tablet (20 mg total) by mouth 2 (two) times daily. Take with food. 05/05/14   Lattie HawStephen A Beese, MD   BP 123/86 mmHg  Pulse 95  Temp(Src) 97.8 F (36.6 C) (Oral)  Ht 5\' 2"  (1.575 m)  Wt 162 lb (73.483 kg)  BMI 29.62 kg/m2  SpO2 98%  LMP 05/12/2011 Physical Exam  Constitutional: She is oriented to person, place, and time. She appears well-developed and well-nourished.  HENT:  Head: Normocephalic and atraumatic.  Right Ear: External ear normal.  Left Ear: External ear normal.  Nose: Nose normal.  Mouth/Throat: Oropharynx is clear and moist.  Eyes: Conjunctivae and  EOM are normal. Pupils are equal, round, and reactive to light.  Neck: Normal range of motion.  Cardiovascular: Normal rate and normal heart sounds.   Pulmonary/Chest: She has wheezes.  Abdominal: Soft. She exhibits no distension.  Musculoskeletal: Normal range of motion.  Neurological: She is alert and oriented to person, place, and time.  Skin: Skin is warm.  Psychiatric: She has a normal mood and affect.  Nursing note and vitals reviewed.   ED Course  Procedures (including critical care time) Labs Review Labs Reviewed - No data to display  Imaging Review Dg Chest 2 View  05/30/2014   CLINICAL DATA:  Cough, congestion for 2-3 weeks.   EXAM: CHEST  2 VIEW  COMPARISON:  02/24/2012  FINDINGS: Airspace consolidation noted in the left lung compatible with pneumonia. Right lung is clear. Heart is normal size. No effusions or acute bony abnormality.  IMPRESSION: Left lower lobe pneumonia.   Electronically Signed   By: Charlett NoseKevin  Dover M.D.   On: 05/30/2014 14:11     MDM   1. CAP (community acquired pneumonia)   2. Cough    Rocephin IM Zithromax RX Albuterol Prednsisone    Lonia SkinnerLeslie K [place]ifflinburg[place], PA-C 05/30/14 1449  Lonia SkinnerLeslie K [place]green[place]ofia, [place]-C 05/30/14 1449

## 2014-09-20 ENCOUNTER — Emergency Department
Admission: EM | Admit: 2014-09-20 | Discharge: 2014-09-20 | Disposition: A | Discharge status: Home / Self Care | Payer: BLUE CROSS/BLUE SHIELD | Source: Home / Self Care | Attending: Family Medicine | Admitting: Family Medicine

## 2014-09-20 ENCOUNTER — Encounter: Payer: Self-pay | Admitting: Emergency Medicine

## 2014-09-20 DIAGNOSIS — B9789 Other viral agents as the cause of diseases classified elsewhere: Principal | ICD-10-CM

## 2014-09-20 DIAGNOSIS — J069 Acute upper respiratory infection, unspecified: Secondary | ICD-10-CM

## 2014-09-20 MED ORDER — BENZONATATE 200 MG PO CAPS: 200.0000 mg | ORAL_CAPSULE | Freq: Every day | ORAL | Status: DC

## 2014-09-20 MED ORDER — CEFDINIR 300 MG PO CAPS: 300.0000 mg | ORAL_CAPSULE | Freq: Two times a day (BID) | ORAL | Status: DC

## 2014-09-20 NOTE — Discharge Instructions (Signed)
Take plain guaifenesin (1200mg extended release tabs such as Mucinex) twice daily, with plenty of water, for cough and congestion.  May add Pseudoephedrine (30mg, one or two every 4 to 6 hours) for sinus congestion.  Get adequate rest.   °May use Afrin nasal spray (or generic oxymetazoline) twice daily for about 5 days.  Also recommend using saline nasal spray several times daily and saline nasal irrigation (AYR is a common brand).   °Try warm salt water gargles for sore throat.  °Stop all antihistamines for now, and other non-prescription cough/cold preparations. °May take Ibuprofen 200mg, 4 tabs every 8 hours with food for chest/sternum discomfort. °   °Follow-up with family doctor if not improving about one week. °

## 2014-09-20 NOTE — ED Provider Notes (Signed)
CSN: [id number]     Arrival date & time 09/20/14  1010 History   First MD Initiated Contact with Patient 09/20/14 1203     Chief Complaint  Patient presents with  . Nasal Congestion      HPI Comments: Complains of 3 day history flu-like illness including myalgias, fever/chills, fatigue, and cough.  Also has mild nasal congestion and sore throat.  Cough is non-productive and somewhat worse at night.  No pleuritic pain but she complains of tightness in her anterior chest.  She complains of wheezing but no shortness of breath.   She has a history of pneumonia in December 2015.     The history is provided by the patient.    Past Medical History  Diagnosis Date  . Wilson disease   . Anxiety and depression    Past Surgical History  Procedure Laterality Date  . Tracheostomy      age 69 due to allergic reaction  . Abdominal surgery      c-section  . Knee surgery     Family History  Problem Relation Age of Onset  . Diabetes Mother   . Asthma Father     smoker   History  Substance Use Topics  . Smoking status: Never Smoker   . Smokeless tobacco: Not on file  . Alcohol Use: Yes   OB History    No data available     Review of Systems + sore throat + cough No pleuritic pain but has tightness in anterior chest + wheezing + nasal congestion + post-nasal drainage No sinus pain/pressure No itchy/red eyes ? earache No hemoptysis No SOB + fever, + chills No nausea No vomiting No abdominal pain No diarrhea No urinary symptoms No skin rash + fatigue + myalgias No headache Used OTC meds without relief  Allergies  Dipth, acell pertus,  Home Medications   Prior to Admission medications   Medication Sig Start Date End Date Taking? Authorizing Provider  albuterol (PROVENTIL HFA;VENTOLIN HFA) 108 (90 BASE) MCG/ACT inhaler Inhale 1-2 puffs into the lungs every 6 (six) hours as needed for wheezing or shortness of breath. 05/30/14   Elson Areas, PA-C  amoxicillin (AMOXIL)  875 MG tablet Take 1 tablet (875 mg total) by mouth 2 (two) times daily. 05/05/14   Lattie Haw, MD  benzonatate (TESSALON) 200 MG capsule Take 1 capsule (200 mg total) by mouth at bedtime. Take as needed for cough 09/20/14   Lattie Haw, MD  cefdinir (OMNICEF) 300 MG capsule Take 1 capsule (300 mg total) by mouth 2 (two) times daily. 09/20/14   Lattie Haw, MD  FLUoxetine (PROZAC) 20 MG capsule Take 20 mg by mouth daily.      Historical Provider, MD  guaifenesin (ROBITUSSIN) 100 MG/5ML syrup Take 200 mg by mouth 3 (three) times daily as needed for cough.    Historical Provider, MD  ketorolac (ACULAR) 0.5 % ophthalmic solution Place 1 drop into both eyes 4 (four) times daily. 09/25/12   Lattie Haw, MD  mometasone (NASONEX) 50 MCG/ACT nasal spray Place two sprays in each nostril once daily 07/01/11   Lattie Haw, MD  penicillAMINE (CUPRIMINE) 250 MG capsule Take 1,000 mg by mouth daily.      Historical Provider, MD  predniSONE (DELTASONE) 50 MG tablet Take with food once a day 05/30/14   Elson Areas, PA-C   BP 116/79 mmHg  Pulse 104  Temp(Src) 98.1 F (36.7 C) (Oral)  Ht  (  1.575 m)  Wt 154 lb (69.854 kg)  BMI 28.16 kg/m2  SpO2 96%  LMP 05/12/2011 Physical Exam Nursing notes and Vital Signs reviewed. Appearance:  Patient appears stated age, and in no acute distress Eyes:  Pupils are equal, round, and reactive to light and accomodation.  Extraocular movement is intact.  Conjunctivae are not inflamed  Ears:  Canals normal.  Tympanic membranes normal.  Nose:  Mildly congested turbinates.  No sinus tenderness.   Pharynx:  Normal Neck:  Supple.  Tender enlarged posterior nodes are palpated bilaterally  Lungs:  Clear to auscultation.  Breath sounds are equal. Chest:  Distinct tenderness to palpation over the mid-sternum.   Heart:  Regular rate and rhythm without murmurs, rubs, or gallops.  Abdomen:  Nontender without masses or hepatosplenomegaly.  Bowel sounds are  present.  No CVA or flank tenderness.  Extremities:  No edema.  No calf tenderness Skin:  No rash present.   ED Course  Procedures  none  MDM   1. Viral URI with cough; suspect influenza initially    Begin Omnicef 300mg  BID.  Prescription written for Benzonatate [facility](Tessalon) to take at bedtime for night-time cough.  Take plain guaifenesin (1200mg  extended release tabs such as Mucinex) twice daily, with plenty of water, for cough and congestion.  May add Pseudoephedrine (30mg , one or two every 4 to 6 hours) for sinus congestion.  Get adequate rest.   May use Afrin nasal spray (or generic oxymetazoline) twice daily for about 5 days.  Also recommend using saline nasal spray several times daily and saline nasal irrigation (AYR is a common brand).   Try warm salt water gargles for sore throat.  Stop all antihistamines for now, and other non-prescription cough/cold preparations. May take Ibuprofen 200mg , 4 tabs every 8 hours with food for chest/sternum discomfort.   Follow-up with family doctor if not improving about one week    Lattie HawStephen A Beese, MD 09/28/14 1630

## 2014-09-20 NOTE — ED Notes (Signed)
3 days- Congestion, chest hurts, body aches, cough, fatigue, slight fever. Had pneumonia in Dec.

## 2014-10-09 ENCOUNTER — Emergency Department (INDEPENDENT_AMBULATORY_CARE_PROVIDER_SITE_OTHER): Payer: BLUE CROSS/BLUE SHIELD

## 2014-10-09 ENCOUNTER — Encounter: Payer: Self-pay | Admitting: Emergency Medicine

## 2014-10-09 ENCOUNTER — Emergency Department
Admission: EM | Admit: 2014-10-09 | Discharge: 2014-10-09 | Disposition: A | Discharge status: Home / Self Care | Payer: BLUE CROSS/BLUE SHIELD | Source: Home / Self Care | Attending: Family Medicine | Admitting: Family Medicine

## 2014-10-09 DIAGNOSIS — R05 Cough: Secondary | ICD-10-CM | POA: Diagnosis not present

## 2014-10-09 DIAGNOSIS — R5383 Other fatigue: Secondary | ICD-10-CM | POA: Diagnosis not present

## 2014-10-09 DIAGNOSIS — R053 Chronic cough: Secondary | ICD-10-CM

## 2014-10-09 DIAGNOSIS — R0989 Other specified symptoms and signs involving the circulatory and respiratory systems: Secondary | ICD-10-CM

## 2014-10-09 MED ORDER — MONTELUKAST SODIUM 10 MG PO TABS: 10.0000 mg | ORAL_TABLET | Freq: Every day | ORAL | Status: AC

## 2014-10-09 NOTE — Discharge Instructions (Signed)
May take Delsym Cough Suppressant at bedtime for nighttime cough.  

## 2014-10-09 NOTE — ED Notes (Signed)
Dry cough, congestion, fatigue, headaches intermittently since Dec. Patient had pneumonia in Dec, then the flu and she hasn't never fully recovered after either illness.

## 2014-10-09 NOTE — ED Provider Notes (Signed)
CSN: [id number]     Arrival date & time 10/09/14  1051 History   First MD Initiated Contact with Patient 10/09/14 1137     Chief Complaint  Patient presents with  . Cough     HPI Comments: Patient returns for follow-up complaining of persistent cough for five months.  She remains fatigued.  She believes that she wheezes at times and has mild shortness of breath with activity.  No recent fevers, chills, and sweats.  No pleuritic pain.  She feels dizzy and light-headed at times. Family history includes asthma in her brother and father.        The history is provided by the patient.    Past Medical History  Diagnosis Date  . Wilson disease   . Anxiety and depression    Past Surgical History  Procedure Laterality Date  . Tracheostomy      age 47 due to allergic reaction  . Abdominal surgery      c-section  . Knee surgery     Family History  Problem Relation Age of Onset  . Diabetes Mother   . Asthma Father     smoker   History  Substance Use Topics  . Smoking status: Never Smoker   . Smokeless tobacco: Not on file  . Alcohol Use: Yes   OB History    No data available     Review of Systems No sore throat + cough No pleuritic pain ? wheezing + nasal congestion + post-nasal drainage No sinus pain/pressure No itchy/red eyes No earache No hemoptysis + mild SOB with activity No fever/chills No nausea No vomiting No abdominal pain No diarrhea No urinary symptoms No skin rash + fatigue No myalgias + headache Used OTC meds without relief  Allergies  Dipth, acell pertus,  Home Medications   Prior to Admission medications   Medication Sig Start Date End Date Taking? Authorizing Provider  albuterol (PROVENTIL HFA;VENTOLIN HFA) 108 (90 BASE) MCG/ACT inhaler Inhale 1-2 puffs into the lungs every 6 (six) hours as needed for wheezing or shortness of breath. 05/30/14   Elson AreasLeslie K Sofia, PA-C  FLUoxetine (PROZAC) 20 MG capsule Take 20 mg by mouth daily.      Historical  Provider, MD  ketorolac (ACULAR) 0.5 % ophthalmic solution Place 1 drop into both eyes 4 (four) times daily. 09/25/12   Lattie HawStephen A Claribel Sachs, MD  mometasone (NASONEX) 50 MCG/ACT nasal spray Place two sprays in each nostril once daily 07/01/11   Lattie HawStephen A Judaea Burgoon, MD  montelukast (SINGULAIR) 10 MG tablet Take 1 tablet (10 mg total) by mouth at bedtime. 10/09/14   Lattie HawStephen A Tressia Labrum, MD  penicillAMINE (CUPRIMINE) 250 MG capsule Take 1,000 mg by mouth daily.      Historical Provider, MD   BP 125/83 mmHg  Pulse 80  Temp(Src) 97.8 F (36.6 C) (Oral)  Ht 5\' 2"  (1.575 m)  Wt 157 lb (71.215 kg)  BMI 28.71 kg/m2  SpO2 97%  LMP 09/10/2010 Physical Exam Nursing notes and Vital Signs reviewed. Appearance:  Patient appears stated age, and in no acute distress Eyes:  Pupils are equal, round, and reactive to light and accomodation.  Extraocular movement is intact.  Conjunctivae are not inflamed  Ears:  Canals normal.  Tympanic membranes normal.  Nose:  Mildly congested turbinates.  No sinus tenderness.    Pharynx:  Normal Neck:  Supple.  Tender shotty posterior nodes are palpated bilaterally  Lungs:  Clear to auscultation.  Breath sounds are equal.  Heart:  Regular rate and rhythm without murmurs, rubs, or gallops.  Abdomen:  Nontender without masses or hepatosplenomegaly.  Bowel sounds are present.  No CVA or flank tenderness.  Extremities:  No edema.  No calf tenderness Skin:  No rash present.   ED Course  Procedures none    Labs Reviewed  POCT CBC W AUTO DIFF ([facility]):  WBC 5.3; LY 35.0; MO 7.3; GR 57.7; Hgb 12.9; Platelets 283     Imaging Review Dg Chest 2 View  10/09/2014   CLINICAL DATA:  Persistent cough, chest congestion, and fatigue. Recent pneumonia approximately 5 months ago.  EXAM: CHEST  2 VIEW  COMPARISON:  07/06/2014  FINDINGS: The heart size and mediastinal contours are within normal limits. Both lungs are clear. The visualized skeletal structures are unremarkable.  IMPRESSION:  Stable exam.  No active cardiopulmonary disease.   Electronically Signed   By: Myles RosenthalJohn  Stahl M.D.   On: 10/09/2014 12:43     MDM   1. Persistent cough for 3 weeks or longer; clear x-ray and normal white blood count reassuring.   Suspect allergy mediated. Begin trial of Singulair. Followup with pulmonologist if not improving 3 to 4 weeks.    Lattie HawStephen A Autym Siess, MD 10/20/14 1056

## 2014-10-12 LAB — POCT CBC W AUTO DIFF (K'VILLE URGENT CARE)

## 2015-06-08 ENCOUNTER — Other Ambulatory Visit (HOSPITAL_BASED_OUTPATIENT_CLINIC_OR_DEPARTMENT_OTHER): Payer: Self-pay | Admitting: Family Medicine

## 2015-06-08 DIAGNOSIS — G43809 Other migraine, not intractable, without status migrainosus: Secondary | ICD-10-CM

## 2015-06-16 ENCOUNTER — Ambulatory Visit (HOSPITAL_BASED_OUTPATIENT_CLINIC_OR_DEPARTMENT_OTHER)
Admission: RE | Admit: 2015-06-16 | Discharge: 2015-06-16 | Disposition: A | Discharge status: Home / Self Care | Payer: BLUE CROSS/BLUE SHIELD | Source: Ambulatory Visit | Attending: Family Medicine | Admitting: Family Medicine

## 2015-06-16 DIAGNOSIS — G43809 Other migraine, not intractable, without status migrainosus: Secondary | ICD-10-CM | POA: Diagnosis not present

## 2015-06-16 DIAGNOSIS — G319 Degenerative disease of nervous system, unspecified: Secondary | ICD-10-CM | POA: Insufficient documentation

## 2015-06-16 DIAGNOSIS — G43909 Migraine, unspecified, not intractable, without status migrainosus: Secondary | ICD-10-CM | POA: Diagnosis present

## 2015-06-16 MED ORDER — GADOBENATE DIMEGLUMINE 529 MG/ML IV SOLN: 14.0000 mL | Freq: Once | INTRAVENOUS | Status: DC | PRN

## 2016-11-29 IMAGING — MR MR HEAD WO/W CM
8 of 10 series · 34 of 48 positions shown · IV contrast (MULTIHANCE)
Comparison: None.

CLINICAL DATA: Migraines for her entire life. Memory disturbance.
Anxiety and light sensitivity.

EXAM:
MRI HEAD WITHOUT AND WITH CONTRAST
TECHNIQUE: Multiplanar, multiecho pulse sequences of the brain and surrounding
structures were obtained without and with intravenous contrast.
CONTRAST:  MultiHance 14 mL.

[Series 2: T1 · sagittal · 5.0mm · 0.45mm/px · 4 of 25 slices shown]
[im 1/25]
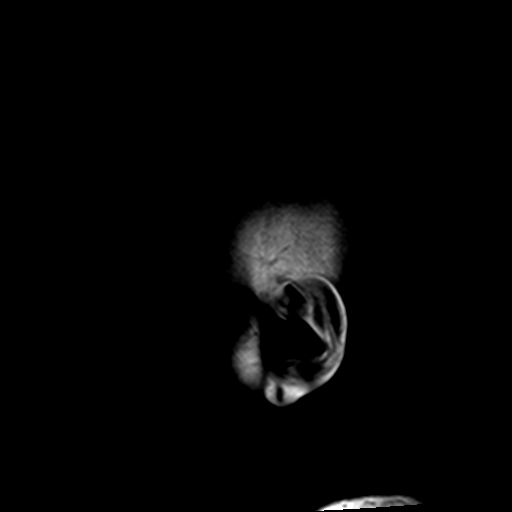
[im 9/25]
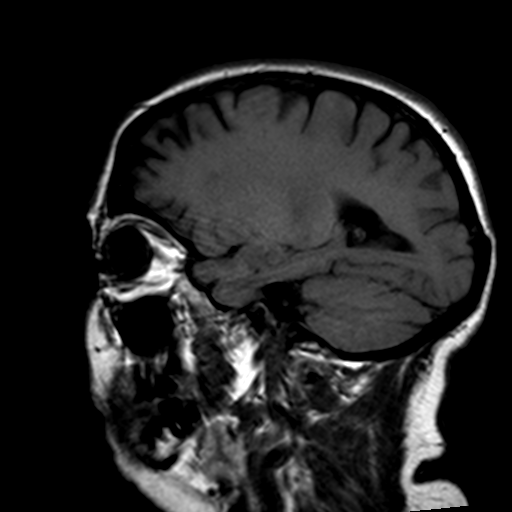
[im 17/25]
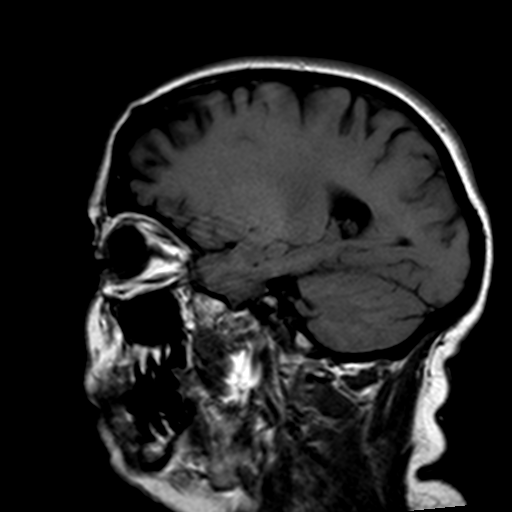
[im 25/25]
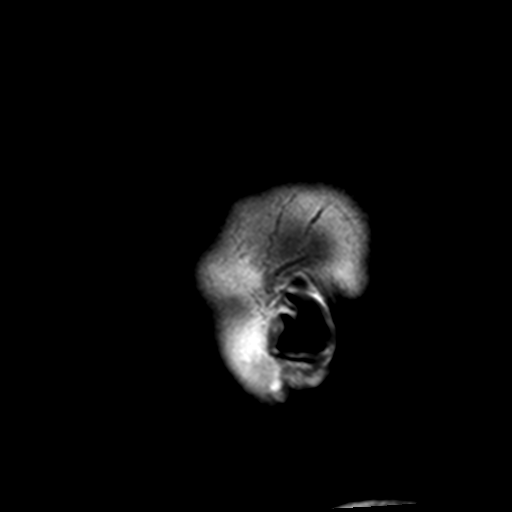

[Series 3: DWI · axial · 3.0mm · 2.19mm/px · z∈[-67,+81]mm · 10 of 89 slices shown (1 of 2)]
[im 1/89]
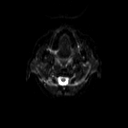
[im 10/89]
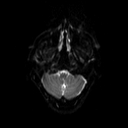
[im 20/89]
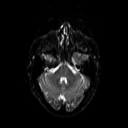
[im 30/89]
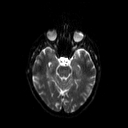
[im 40/89]
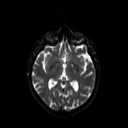
[im 49/89]
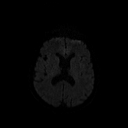
[im 59/89]
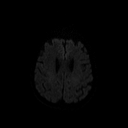
[im 69/89]
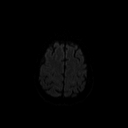
[im 79/89]
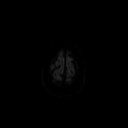
[im 89/89]
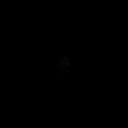

[Series 4: DWI · axial · 3.0mm · 2.19mm/px · z∈[-67,+81]mm · 5 of 46 slices shown (2 of 2)]
[im 1/46]
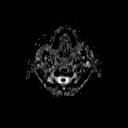
[im 12/46]
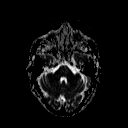
[im 23/46]
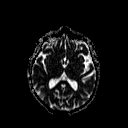
[im 34/46]
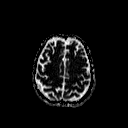
[im 46/46]
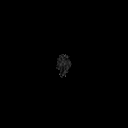

[Series 5: T2 · axial · 5.0mm · 0.45mm/px · z∈[-69,+92]mm · 3 of 24 slices shown (1 of 2)]
[im 1/24]
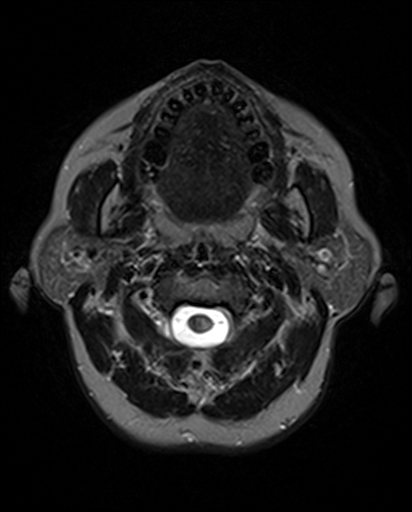
[im 12/24]
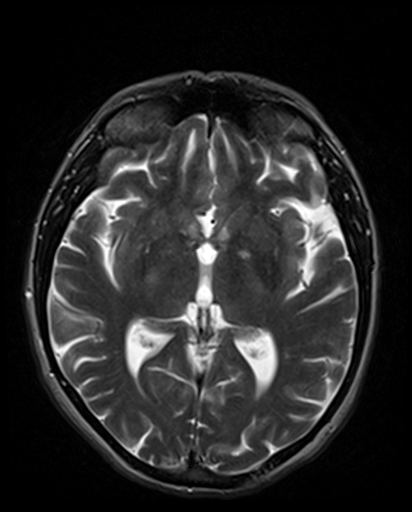
[im 24/24]
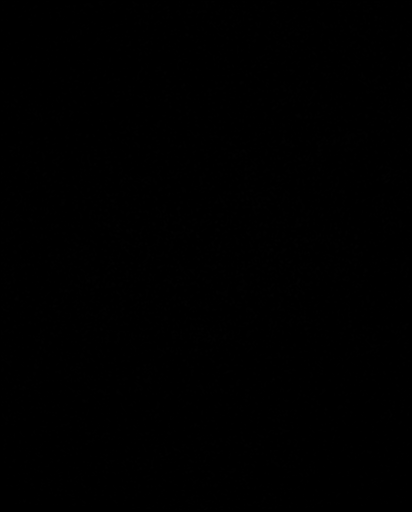

[Series 6: T2 · axial · 5.0mm · 0.45mm/px · z∈[-69,+92]mm · 3 of 24 slices shown (2 of 2)]
[im 1/24]
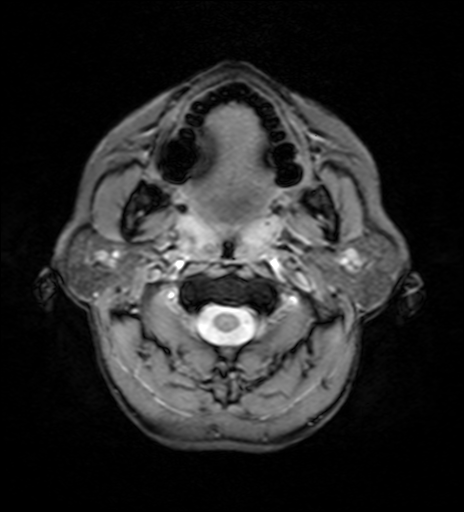
[im 12/24]
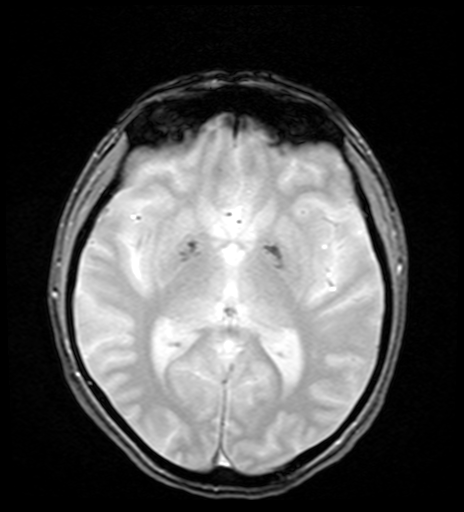
[im 24/24]
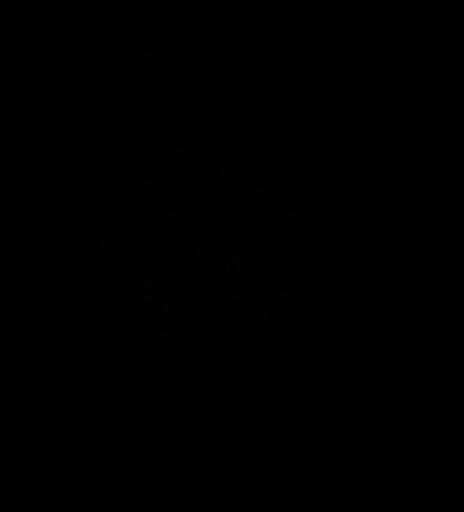

[Series 7: FLAIR · axial · 5.0mm · 0.45mm/px · z∈[-69,+92]mm · 3 of 24 slices shown]
[im 1/24]
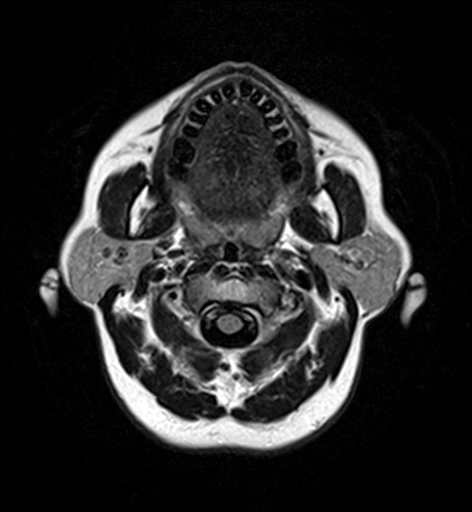
[im 12/24]
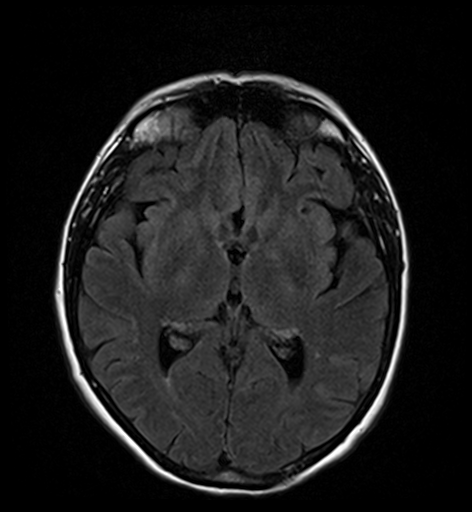
[im 24/24]
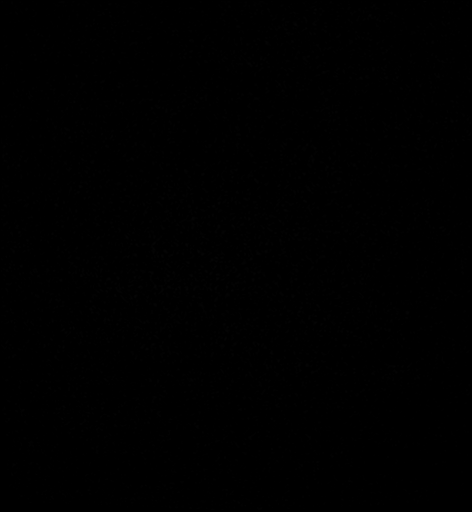

[Series 9: T2 post-contrast · coronal · 5.0mm · 0.45mm/px · 3 of 29 slices shown]
[im 1/29]
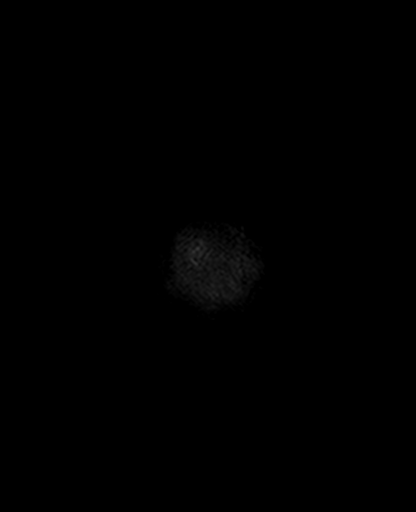
[im 15/29]
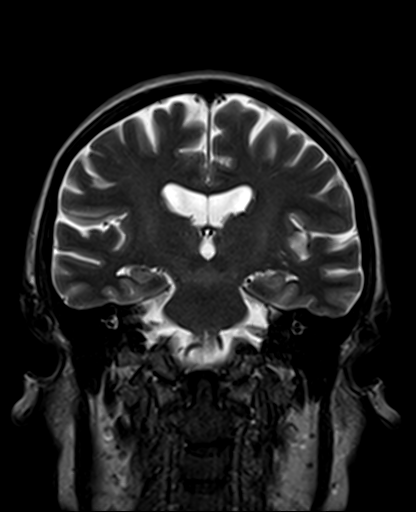
[im 29/29]
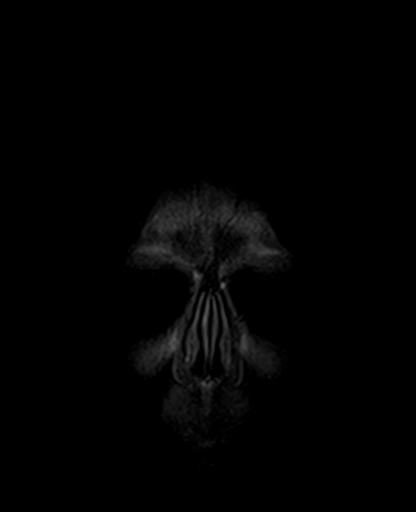

[Series 11: T1 post-contrast · coronal · 5.0mm · 0.45mm/px · 3 of 29 slices shown]
[im 1/29]
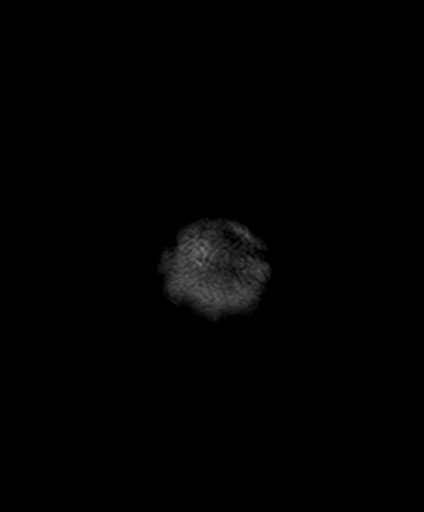
[im 15/29]
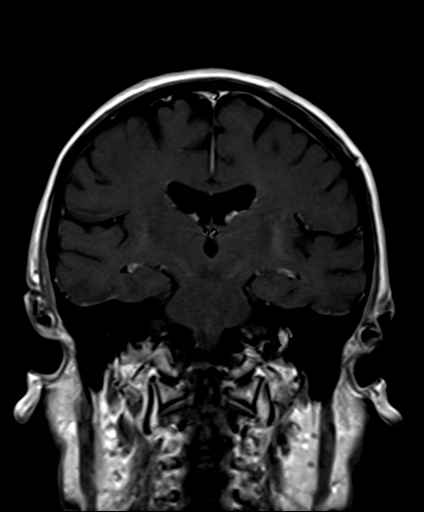
[im 29/29]
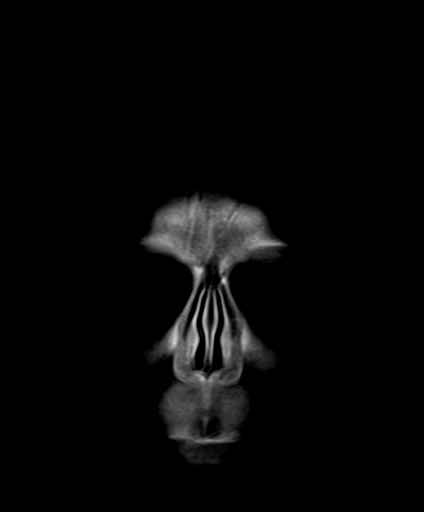

[34 of 48 positions shown; findings below may reference images not displayed]

FINDINGS: No evidence for acute infarction, hemorrhage, mass lesion,
hydrocephalus, or extra-axial fluid.

Premature for age atrophy. No white matter disease of significance.
Specifically, no evidence for complicated migraine or large vessel
infarct. Partial empty sella. No tonsillar herniation. Flow voids
are maintained.

Post infusion, no abnormal enhancement of the brain or meninges.

Visualized calvarium, skull base, and upper cervical osseous
structures unremarkable. Scalp and extracranial soft tissues,
orbits, sinuses, and mastoids show no acute process.
IMPRESSION: Premature for age cerebral and cerebellar atrophy. No acute
intracranial findings.

Partial empty sella, likely incidental.

No significant white matter disease to suggest complicated migraine
or chronic microvascular ischemic change.
